# Patient Record
Sex: Male | Born: 1965 | Race: White | Hispanic: No | Smoking: Current every day smoker
Health system: Southern US, Community
[De-identification: ages and names within clinical notes are randomized; demographics above are authoritative.]

## PROBLEM LIST (undated history)

## (undated) DIAGNOSIS — E119 Type 2 diabetes mellitus without complications: Secondary | ICD-10-CM

## (undated) HISTORY — PX: FOOT SURGERY: SHX648

## (undated) HISTORY — PX: LEG SURGERY: SHX1003

---

## 2009-05-06 ENCOUNTER — Emergency Department (HOSPITAL_COMMUNITY): Admission: EM | Admit: 2009-05-06 | Discharge: 2009-05-06 | Payer: Self-pay | Admitting: Emergency Medicine

## 2009-06-16 ENCOUNTER — Encounter (INDEPENDENT_AMBULATORY_CARE_PROVIDER_SITE_OTHER): Payer: Self-pay | Admitting: Emergency Medicine

## 2009-06-16 ENCOUNTER — Ambulatory Visit: Payer: Self-pay | Admitting: Vascular Surgery

## 2009-06-16 ENCOUNTER — Emergency Department (HOSPITAL_COMMUNITY): Admission: EM | Admit: 2009-06-16 | Discharge: 2009-06-16 | Payer: Self-pay | Admitting: Emergency Medicine

## 2010-07-03 LAB — CBC
Hemoglobin: 15.1 g/dL (ref 13.0–17.0)
MCHC: 32.6 g/dL (ref 30.0–36.0)
MCV: 90.4 fL (ref 78.0–100.0)
RBC: 5.14 MIL/uL (ref 4.22–5.81)
RDW: 15.3 % (ref 11.5–15.5)

## 2010-07-03 LAB — DIFFERENTIAL
Basophils Absolute: 0.1 10*3/uL (ref 0.0–0.1)
Basophils Relative: 1 % (ref 0–1)
Eosinophils Relative: 1 % (ref 0–5)
Lymphocytes Relative: 33 % (ref 12–46)
Neutro Abs: 6.3 10*3/uL (ref 1.7–7.7)
Neutrophils Relative %: 57 % (ref 43–77)

## 2010-07-03 LAB — BASIC METABOLIC PANEL
CO2: 26 mEq/L (ref 19–32)
Calcium: 9.4 mg/dL (ref 8.4–10.5)
Creatinine, Ser: 0.91 mg/dL (ref 0.4–1.5)
GFR calc Af Amer: >60 mL/min (ref >60)
GFR calc non Af Amer: >60 mL/min (ref >60)
Sodium: 138 mEq/L (ref 135–145)

## 2010-07-03 LAB — GLUCOSE, CAPILLARY: Glucose-Capillary: 96 mg/dL (ref 70–99)

## 2010-07-03 LAB — WOUND CULTURE

## 2015-01-04 ENCOUNTER — Emergency Department (HOSPITAL_BASED_OUTPATIENT_CLINIC_OR_DEPARTMENT_OTHER)
Admission: EM | Admit: 2015-01-04 | Discharge: 2015-01-04 | Disposition: A | Discharge status: Home / Self Care | Payer: Self-pay | Attending: Emergency Medicine | Admitting: Emergency Medicine

## 2015-01-04 ENCOUNTER — Encounter (HOSPITAL_BASED_OUTPATIENT_CLINIC_OR_DEPARTMENT_OTHER): Payer: Self-pay

## 2015-01-04 ENCOUNTER — Emergency Department (HOSPITAL_BASED_OUTPATIENT_CLINIC_OR_DEPARTMENT_OTHER): Payer: Self-pay

## 2015-01-04 DIAGNOSIS — G8929 Other chronic pain: Secondary | ICD-10-CM | POA: Insufficient documentation

## 2015-01-04 DIAGNOSIS — M25551 Pain in right hip: Secondary | ICD-10-CM | POA: Insufficient documentation

## 2015-01-04 DIAGNOSIS — E119 Type 2 diabetes mellitus without complications: Secondary | ICD-10-CM | POA: Insufficient documentation

## 2015-01-04 DIAGNOSIS — Z720 Tobacco use: Secondary | ICD-10-CM | POA: Insufficient documentation

## 2015-01-04 DIAGNOSIS — R200 Anesthesia of skin: Secondary | ICD-10-CM | POA: Insufficient documentation

## 2015-01-04 HISTORY — DX: Type 2 diabetes mellitus without complications: E11.9

## 2015-01-04 MED ORDER — IBUPROFEN 600 MG PO TABS: 600.0000 mg | ORAL_TABLET | Freq: Four times a day (QID) | ORAL | Status: AC | PRN

## 2015-01-04 NOTE — Discharge Instructions (Signed)
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
